# Patient Record
Sex: Male | Born: 1940 | Race: White | Hispanic: No | Marital: Married | Smoking: Never smoker
Health system: Southern US, Community
[De-identification: ages and names within clinical notes are randomized; demographics above are authoritative.]

## PROBLEM LIST (undated history)

## (undated) DIAGNOSIS — G459 Transient cerebral ischemic attack, unspecified: Secondary | ICD-10-CM

---

## 2014-02-23 ENCOUNTER — Emergency Department (HOSPITAL_COMMUNITY): Payer: Medicare Other

## 2014-02-23 ENCOUNTER — Emergency Department (HOSPITAL_COMMUNITY)
Admission: EM | Admit: 2014-02-23 | Discharge: 2014-02-23 | Disposition: A | Discharge status: Home / Self Care | Payer: Medicare Other | Attending: Emergency Medicine | Admitting: Emergency Medicine

## 2014-02-23 ENCOUNTER — Encounter (HOSPITAL_COMMUNITY): Payer: Self-pay | Admitting: Emergency Medicine

## 2014-02-23 DIAGNOSIS — Y92009 Unspecified place in unspecified non-institutional (private) residence as the place of occurrence of the external cause: Secondary | ICD-10-CM | POA: Insufficient documentation

## 2014-02-23 DIAGNOSIS — W540XXA Bitten by dog, initial encounter: Secondary | ICD-10-CM | POA: Diagnosis present

## 2014-02-23 DIAGNOSIS — S61409A Unspecified open wound of unspecified hand, initial encounter: Secondary | ICD-10-CM | POA: Insufficient documentation

## 2014-02-23 DIAGNOSIS — Z23 Encounter for immunization: Secondary | ICD-10-CM | POA: Insufficient documentation

## 2014-02-23 DIAGNOSIS — S61209A Unspecified open wound of unspecified finger without damage to nail, initial encounter: Secondary | ICD-10-CM | POA: Insufficient documentation

## 2014-02-23 DIAGNOSIS — S61419A Laceration without foreign body of unspecified hand, initial encounter: Secondary | ICD-10-CM | POA: Diagnosis present

## 2014-02-23 DIAGNOSIS — Z8673 Personal history of transient ischemic attack (TIA), and cerebral infarction without residual deficits: Secondary | ICD-10-CM | POA: Insufficient documentation

## 2014-02-23 DIAGNOSIS — S63289A Dislocation of proximal interphalangeal joint of unspecified finger, initial encounter: Secondary | ICD-10-CM | POA: Diagnosis present

## 2014-02-23 DIAGNOSIS — Y9389 Activity, other specified: Secondary | ICD-10-CM | POA: Insufficient documentation

## 2014-02-23 DIAGNOSIS — S63279A Dislocation of unspecified interphalangeal joint of unspecified finger, initial encounter: Secondary | ICD-10-CM

## 2014-02-23 HISTORY — DX: Transient cerebral ischemic attack, unspecified: G45.9

## 2014-02-23 MED ORDER — MORPHINE SULFATE 4 MG/ML IJ SOLN
4.0000 mg | Freq: Once | INTRAMUSCULAR | Status: AC
  Administered 2014-02-23: 4 mg via INTRAVENOUS
  Filled 2014-02-23: qty 1

## 2014-02-23 MED ORDER — OXYCODONE-ACETAMINOPHEN 5-325 MG PO TABS: 1.0000 | ORAL_TABLET | Freq: Four times a day (QID) | ORAL | Status: AC | PRN

## 2014-02-23 MED ORDER — TETANUS-DIPHTH-ACELL PERTUSSIS 5-2.5-18.5 LF-MCG/0.5 IM SUSP
0.5000 mL | Freq: Once | INTRAMUSCULAR | Status: AC
  Administered 2014-02-23: 0.5 mL via INTRAMUSCULAR
  Filled 2014-02-23: qty 0.5

## 2014-02-23 MED ORDER — CEFAZOLIN SODIUM 1-5 GM-% IV SOLN
1.0000 g | Freq: Once | INTRAVENOUS | Status: AC
  Administered 2014-02-23: 1 g via INTRAVENOUS
  Filled 2014-02-23: qty 50

## 2014-02-23 MED ORDER — AMOXICILLIN-POT CLAVULANATE 875-125 MG PO TABS: 1.0000 | ORAL_TABLET | Freq: Two times a day (BID) | ORAL | Status: AC

## 2014-02-23 MED ORDER — OXYCODONE-ACETAMINOPHEN 5-325 MG PO TABS
1.0000 | ORAL_TABLET | Freq: Once | ORAL | Status: AC
  Administered 2014-02-23: 1 via ORAL
  Filled 2014-02-23: qty 1

## 2014-02-23 MED ORDER — AMOXICILLIN-POT CLAVULANATE 875-125 MG PO TABS
1.0000 | ORAL_TABLET | Freq: Once | ORAL | Status: AC
  Administered 2014-02-23: 1 via ORAL
  Filled 2014-02-23: qty 1

## 2014-02-23 NOTE — Discharge Instructions (Signed)
Animal Bite °An animal bite can result in a scratch on the skin, deep open cut, puncture of the skin, crush injury, or tearing away of the skin or a body part. Dogs are responsible for most animal bites. Children are bitten more often than adults. An animal bite can range from very mild to more serious. A small bite from your house pet is no cause for alarm. However, some animal bites can become infected or injure a bone or other tissue. You must seek medical care if: °· The skin is broken and bleeding does not slow down or stop after 15 minutes. °· The puncture is deep and difficult to clean (such as a cat bite). °· Pain, warmth, redness, or pus develops around the wound. °· The bite is from a stray animal or rodent. There may be a risk of rabies infection. °· The bite is from a snake, raccoon, skunk, fox, coyote, or bat. There may be a risk of rabies infection. °· The person bitten has a chronic illness such as diabetes, liver disease, or cancer, or the person takes medicine that lowers the immune system. °· There is concern about the location and severity of the bite. °It is important to clean and protect an animal bite wound right away to prevent infection. Follow these steps: °· Clean the wound with plenty of water and soap. °· Apply an antibiotic cream. °· Apply gentle pressure over the wound with a clean towel or gauze to slow or stop bleeding. °· Elevate the affected area above the heart to help stop any bleeding. °· Seek medical care. Getting medical care within 8 hours of the animal bite leads to the best possible outcome. °DIAGNOSIS  °Your caregiver will most likely: °· Take a detailed history of the animal and the bite injury. °· Perform a wound exam. °· Take your medical history. °Blood tests or X-rays may be performed. Sometimes, infected bite wounds are cultured and sent to a lab to identify the infectious bacteria.  °TREATMENT  °Medical treatment will depend on the location and type of animal bite as  well as the patient's medical history. Treatment may include: °· Wound care, such as cleaning and flushing the wound with saline solution, bandaging, and elevating the affected area. °· Antibiotics. °· Tetanus immunization. °· Rabies immunization. °· Leaving the wound open to heal. This is often done with animal bites, due to the high risk of infection. However, in certain cases, wound closure with stitches, wound adhesive, skin adhesive strips, or staples may be used. ° Infected bites that are left untreated may require intravenous (IV) antibiotics and surgical treatment in the hospital. °HOME CARE INSTRUCTIONS °· Follow your caregiver's instructions for wound care. °· Take all medicines as directed. °· If your caregiver prescribes antibiotics, take them as directed. Finish them even if you start to feel better. °· Follow up with your caregiver for further exams or immunizations as directed. °You may need a tetanus shot if: °· You cannot remember when you had your last tetanus shot. °· You have never had a tetanus shot. °· The injury broke your skin. °If you get a tetanus shot, your arm may swell, get red, and feel warm to the touch. This is common and not a problem. If you need a tetanus shot and you choose not to have one, there is a rare chance of getting tetanus. Sickness from tetanus can be serious. °SEEK MEDICAL CARE IF: °· You notice warmth, redness, soreness, swelling, pus discharge, or a bad   smell coming from the wound.  You have a red line on the skin coming from the wound.  You have a fever, chills, or a general ill feeling.  You have nausea or vomiting.  You have continued or worsening pain.  You have trouble moving the injured part.  You have other questions or concerns. MAKE SURE YOU:  Understand these instructions.  Will watch your condition.  Will get help right away if you are not doing well or get worse. Document Released: 06/13/2011 Document Revised: 12/18/2011 Document  Reviewed: 06/13/2011 [facility]Exit[facility] Patient Information 2014 [place], [place].  Cast or Splint Care Casts and splints support injured limbs and keep bones from moving while they heal. It is important to care for your cast or splint at home.  HOME CARE INSTRUCTIONS  Keep the cast or splint uncovered during the drying period. It can take 24 to 48 hours to dry if it is made of plaster. A fiberglass cast will dry in less than 1 hour.  Do not rest the cast on anything harder than a pillow for the first 24 hours.  Do not put weight on your injured limb or apply pressure to the cast until your health care provider gives you permission.  Keep the cast or splint dry. Wet casts or splints can lose their shape and may not support the limb as well. A wet cast that has lost its shape can also create harmful pressure on your skin when it dries. Also, wet skin can become infected.  Cover the cast or splint with a plastic bag when bathing or when out in the rain or snow. If the cast is on the trunk of the body, take sponge baths until the cast is removed.  If your cast does become wet, dry it with a towel or a blow dryer on the cool setting only.  Keep your cast or splint clean. Soiled casts may be wiped with a moistened cloth.  Do not place any hard or soft foreign objects under your cast or splint, such as cotton, toilet paper, lotion, or powder.  Do not try to scratch the skin under the cast with any object. The object could get stuck inside the cast. Also, scratching could lead to an infection. If itching is a problem, use a blow dryer on a cool setting to relieve discomfort.  Do not trim or cut your cast or remove padding from inside of it.  Exercise all joints next to the injury that are not immobilized by the cast or splint. For example, if you have a long leg cast, exercise the hip joint and toes. If you have an arm cast or splint, exercise the shoulder, elbow, thumb, and fingers.  Elevate your  injured arm or leg on 1 or 2 pillows for the first 1 to 3 days to decrease swelling and pain.It is best if you can comfortably elevate your cast so it is higher than your heart. SEEK MEDICAL CARE IF:   Your cast or splint cracks.  Your cast or splint is too tight or too loose.  You have unbearable itching inside the cast.  Your cast becomes wet or develops a soft spot or area.  You have a bad smell coming from inside your cast.  You get an object stuck under your cast.  Your skin around the cast becomes red or raw.  You have new pain or worsening pain after the cast has been applied. SEEK IMMEDIATE MEDICAL CARE IF:   You have  fluid leaking through the cast.  You are unable to move your fingers or toes.  You have discolored (blue or white), cool, painful, or very swollen fingers or toes beyond the cast.  You have tingling or numbness around the injured area.  You have severe pain or pressure under the cast.  You have any difficulty with your breathing or have shortness of breath.  You have chest pain. Document Released: 09/22/2000 Document Revised: 07/16/2013 Document Reviewed: 04/03/2013 [facility] Patient Information 2014 [place]Exit[place], [place].

## 2014-02-23 NOTE — ED Provider Notes (Signed)
CSN: [id number]     Arrival date & time 02/23/14  1558 History   First MD Initiated Contact with Patient 02/23/14 1641     Chief Complaint  Patient presents with  . Animal Bite     (Consider location/radiation/quality/duration/timing/severity/associated sxs/prior Treatment) HPI Comments: 73 year old male who presents with a dog bite which occurred approximately 2 hours ago. He states that his household whippets began fighting and he tried to separate them. He suffered a laceration and likely fracture in his right hand. He states that he is right-handed. He is unsure of his tetanus status. His pain is currently a 4/10. He rates the severity as moderate. He notes that the rabies vaccine in both his dogs is up-to-date and has this documentation with him. There is no concern for rabies in these animals as they're known household dogs. He states that his pain is aching and sharp in nature. It is constant. He has not taken anything for the pain yet. Nothing has relieved his pain thus far.  Patient is a 73 y.o. male presenting with animal bite. The history is provided by the patient.  Animal Bite Associated symptoms: no fever and no numbness     Past Medical History  Diagnosis Date  . TIA (transient ischemic attack)    History reviewed. No pertinent past surgical history. No family history on file. History  Substance Use Topics  . Smoking status: Never Smoker   . Smokeless tobacco: Not on file  . Alcohol Use: Yes    Review of Systems  Constitutional: Negative for fever.  HENT: Negative for drooling and rhinorrhea.   Eyes: Negative for pain.  Respiratory: Negative for cough and shortness of breath.   Cardiovascular: Negative for chest pain and leg swelling.  Gastrointestinal: Negative for nausea, vomiting, abdominal pain and diarrhea.  Genitourinary: Negative for dysuria and hematuria.  Musculoskeletal: Negative for gait problem and neck pain.  Skin: Negative for color change.    Neurological: Negative for numbness and headaches.  Hematological: Negative for adenopathy.  Psychiatric/Behavioral: Negative for behavioral problems.  All other systems reviewed and are negative.     Allergies  Review of patient's allergies indicates not on file.  Home Medications   Prior to Admission medications   Not on File   BP 141/72  Pulse 80  Temp(Src) 98.1 F (36.7 C) (Oral)  Resp 18  Ht 6\' 1"  (1.854 m)  Wt 201 lb (91.173 kg)  BMI 26.52 kg/m2  SpO2 97% Physical Exam  Nursing note and vitals reviewed. Constitutional: He is oriented to person, place, and time. He appears well-developed and well-nourished.  HENT:  Head: Normocephalic and atraumatic.  Right Ear: External ear normal.  Left Ear: External ear normal.  Nose: Nose normal.  Mouth/Throat: Oropharynx is clear and moist. No oropharyngeal exudate.  Eyes: Conjunctivae and EOM are normal. Pupils are equal, round, and reactive to light.  Neck: Normal range of motion. Neck supple.  Cardiovascular: Normal rate, regular rhythm, normal heart sounds and intact distal pulses.  Exam reveals no gallop and no friction rub.   No murmur heard. Pulmonary/Chest: Effort normal and breath sounds normal. No respiratory distress. He has no wheezes.  Abdominal: Soft. Bowel sounds are normal. He exhibits no distension. There is no tenderness. There is no rebound and no guarding.  Musculoskeletal: Normal range of motion. He exhibits no edema and no tenderness.  2 superficial stellate lacerations approximately 1 cm and 2 cm in length located on the base and volar aspect of the  ring finger on the right hand.  A 4 cm laceration is also noted on the volar aspect of the mid phalanx of the little finger on the right hand. There is exposure of the PIP joint of this finger w/ mild to mod gross dislocation of the finger laterally.   1cm stellate laceration to distal lateral aspect of 5th digit of right hand.  <2sec cap refill in 4th and  5th distal phalanx on right hand.   Sensation is diffusely intact in the right hand.  2+ pulses in the distal right upper extremity.  Normal range of motion and motor skills in the right hand. Motor skills deferred in the small digit of the right hand due to the gross deformity.  A small superficial puncture wound is noted on the dorsal aspect of the left hand.    Neurological: He is alert and oriented to person, place, and time.  Skin: Skin is warm and dry.  Psychiatric: He has a normal mood and affect. His behavior is normal.    ED Course  LACERATION REPAIR Date/Time: 02/23/2014 9:13 PM Performed by: Purvis SheffieldHARRISON, Kayra Crowell, S Authorized by: Purvis SheffieldHARRISON, Kyera Felan, S Consent: Verbal consent obtained. Risks and benefits: risks, benefits and alternatives were discussed Consent given by: patient Patient understanding: patient states understanding of the procedure being performed Patient identity confirmed: verbally with patient, arm band, provided demographic data and hospital-assigned identification number Time out: Immediately prior to procedure a "time out" was called to verify the correct patient, procedure, equipment, support staff and site/side marked as required. Location: base of 5th digit, right hand. Laceration length: 4 cm Foreign bodies: no foreign bodies Tendon involvement: none Nerve involvement: none Vascular damage: no Anesthesia: local infiltration Local anesthetic: lidocaine 1% without epinephrine Anesthetic total: 5 ml Patient sedated: no Preparation: Patient was prepped and draped in the usual sterile fashion. Irrigation solution: saline Irrigation method: jet lavage Amount of cleaning: extensive (2 liters) Debridement: none Degree of undermining: none Skin closure: 5-0 Prolene Number of sutures: 3 Technique: simple Approximation: loose Approximation difficulty: simple Dressing: antibiotic ointment and 4x4 sterile gauze Patient tolerance: Patient tolerated the  procedure well with no immediate complications.  Reduction of dislocation Date/Time: 02/23/2014 9:32 PM Performed by: Purvis SheffieldHARRISON, Shaylah Mcghie, S Authorized by: Purvis SheffieldHARRISON, Keelia Graybill, S Consent: Verbal consent obtained. Risks and benefits: risks, benefits and alternatives were discussed Consent given by: patient Patient understanding: patient states understanding of the procedure being performed Required items: required blood products, implants, devices, and special equipment available Patient identity confirmed: verbally with patient, arm band, provided demographic data and hospital-assigned identification number Time out: Immediately prior to procedure a "time out" was called to verify the correct patient, procedure, equipment, support staff and site/side marked as required. Preparation: Patient was prepped and draped in the usual sterile fashion. Local anesthesia used: yes Anesthesia: digital block Local anesthetic: lidocaine 2% without epinephrine Anesthetic total: 5 ml Patient sedated: no Patient tolerance: Patient tolerated the procedure well with no immediate complications.   (including critical care time) LACERATION REPAIR Performed by: Junius ArgyleForrest S Adonai Helzer Consent: Verbal consent obtained. Risks and benefits: risks, benefits and alternatives were discussed Patient identity confirmed: provided demographic data Time out performed prior to procedure Prepped and Draped in normal sterile fashion Wound explored Laceration Location: base of 4th digit, right hand Laceration Length: 2 cm No Foreign Bodies seen or palpated Anesthesia: local infiltration Local anesthetic: lidocaine 2%  W/out epi Anesthetic total: 4 ml Irrigation method: syringe Amount of cleaning: extensive Skin closure: 5-0 prolene Number of  sutures or staples: 1 Technique: simple Patient tolerance: Patient tolerated the procedure well with no immediate complications. Labs Review Labs Reviewed - No data to  display  Imaging Review Dg Hand Complete Right  02/23/2014   CLINICAL DATA:  Status post reduction.  Dog bite.  EXAM: RIGHT HAND - COMPLETE 3+ VIEW  COMPARISON:  02/23/2014 at 1805 hr.  FINDINGS: Overlying bandage obscures bony detail about the fourth and fifth digits. Degenerate change at the base of the first metacarpal. Overlap of fingers on the lateral view. Interval relocation of the proximal interphalangeal joint of the fifth digit. No convincing evidence of underlying fracture. Question remote trauma of the fifth metacarpal neck.  IMPRESSION: Interval relocation of the proximal interphalangeal joint of the fifth digit   Electronically Signed   By: Jeronimo GreavesKyle  Talbot M.D.   On: 02/23/2014 21:05   Dg Hand Complete Right  02/23/2014   CLINICAL DATA:  Laceration to base of fourth/ fifth digit. Exposed bone on the fifth digit.  EXAM: RIGHT HAND - COMPLETE 3+ VIEW  COMPARISON:  None.  FINDINGS: Degenerative changes at the base of the first metacarpal. Scattered degenerative changes also identified, including within the second-third interphalangeal joints.  Dislocation of the proximal interphalangeal joint of the fifth digit, with the middle phalanx dislocated ulnarly and posteriorly. Soft tissue injury, consistent with an open dislocation. No definite fracture identified. No radiopaque foreign object.  IMPRESSION: Open dislocation of the proximal interphalangeal joint of the fifth digit.   Electronically Signed   By: Jeronimo GreavesKyle  Talbot M.D.   On: 02/23/2014 18:40     EKG Interpretation None          MDM   Final diagnoses:  Dislocation of PIP joint of finger  Dog bite  Laceration of hand    5:01 PM 73 y.o. male on aggrenox d/t hx of TIA who presents after a dog bite. Irrigated and cleaned wounds. Will get plain films, update tetanus, pain control, Ancef IV due to open fracture. Will perform digital block prior to plain films.   Discussed case w/ Dr. Mina MarbleWeingold who recommends irrigation, reduction,  repair, splint, and abx.   I reduced the right 5th digit after a digital block. He tolerated the procedure well. Re-examination of the hand shows that motor skills remain intact. 2+ distal pulses. Wound irrigated w/ 2L sterile saline.I placed a few sutures to hold the wound together. Will have nurse bandage, ortho tech to splint.   9:33 PM: Post reduction films reviewed. Pt continues to appear well, pain controlled. Will place on augmentin. I have discussed the diagnosis/risks/treatment options with the patient and believe the pt to be eligible for discharge home to follow-up with Dr. Mina MarbleWeingold here tomorrow. We also discussed returning to the ED immediately if new or worsening sx occur. We discussed the sx which are most concerning (e.g., concern for infection, increased bleeding, worsening pain) that necessitate immediate return. Medications administered to the patient during their visit and any new prescriptions provided to the patient are listed below.  Medications given during this visit Medications  Tdap (BOOSTRIX) injection 0.5 mL (0.5 mLs Intramuscular Given 02/23/14 1723)  ceFAZolin (ANCEF) IVPB 1 g/50 mL premix (0 g Intravenous Stopped 02/23/14 1844)  morphine 4 MG/ML injection 4 mg (4 mg Intravenous Given 02/23/14 1723)  amoxicillin-clavulanate (AUGMENTIN) 875-125 MG per tablet 1 tablet (1 tablet Oral Given 02/23/14 1959)    New Prescriptions   AMOXICILLIN-CLAVULANATE (AUGMENTIN) 875-125 MG PER TABLET    Take 1 tablet by mouth 2 (  two) times daily. One po bid x 7 days   OXYCODONE-ACETAMINOPHEN (PERCOCET) 5-325 MG PER TABLET    Take 1-2 tablets by mouth every 6 (six) hours as needed for moderate pain.     Junius Argyle, MD 02/23/14 2248

## 2014-02-23 NOTE — Progress Notes (Signed)
Orthopedic Tech Progress Note Patient Details:  Roberto MartinezWilliam C Medina [birth year] [id number]  Ortho Devices Type of Ortho Device: Ace wrap;Finger splint Ortho Device/Splint Location: rue Ortho Device/Splint Interventions: Application   Marvina Danner 02/23/2014, 9:39 PM

## 2014-02-23 NOTE — ED Notes (Signed)
Pt st's he was trying to break up his 2 dogs from fighting and one of his dogs bit him.  Pt has lac to right 4th and 5th fingers with deformity to 5 finger.

## 2015-03-21 IMAGING — CR DG HAND COMPLETE 3+V*R*
3 series · 3 of 3 positions shown · non-contrast
Comparison: 02/23/2014 at 8895 hr.

CLINICAL DATA: Status post reduction.  Dog bite.

EXAM:
RIGHT HAND - COMPLETE 3+ VIEW

[x hand pa right]
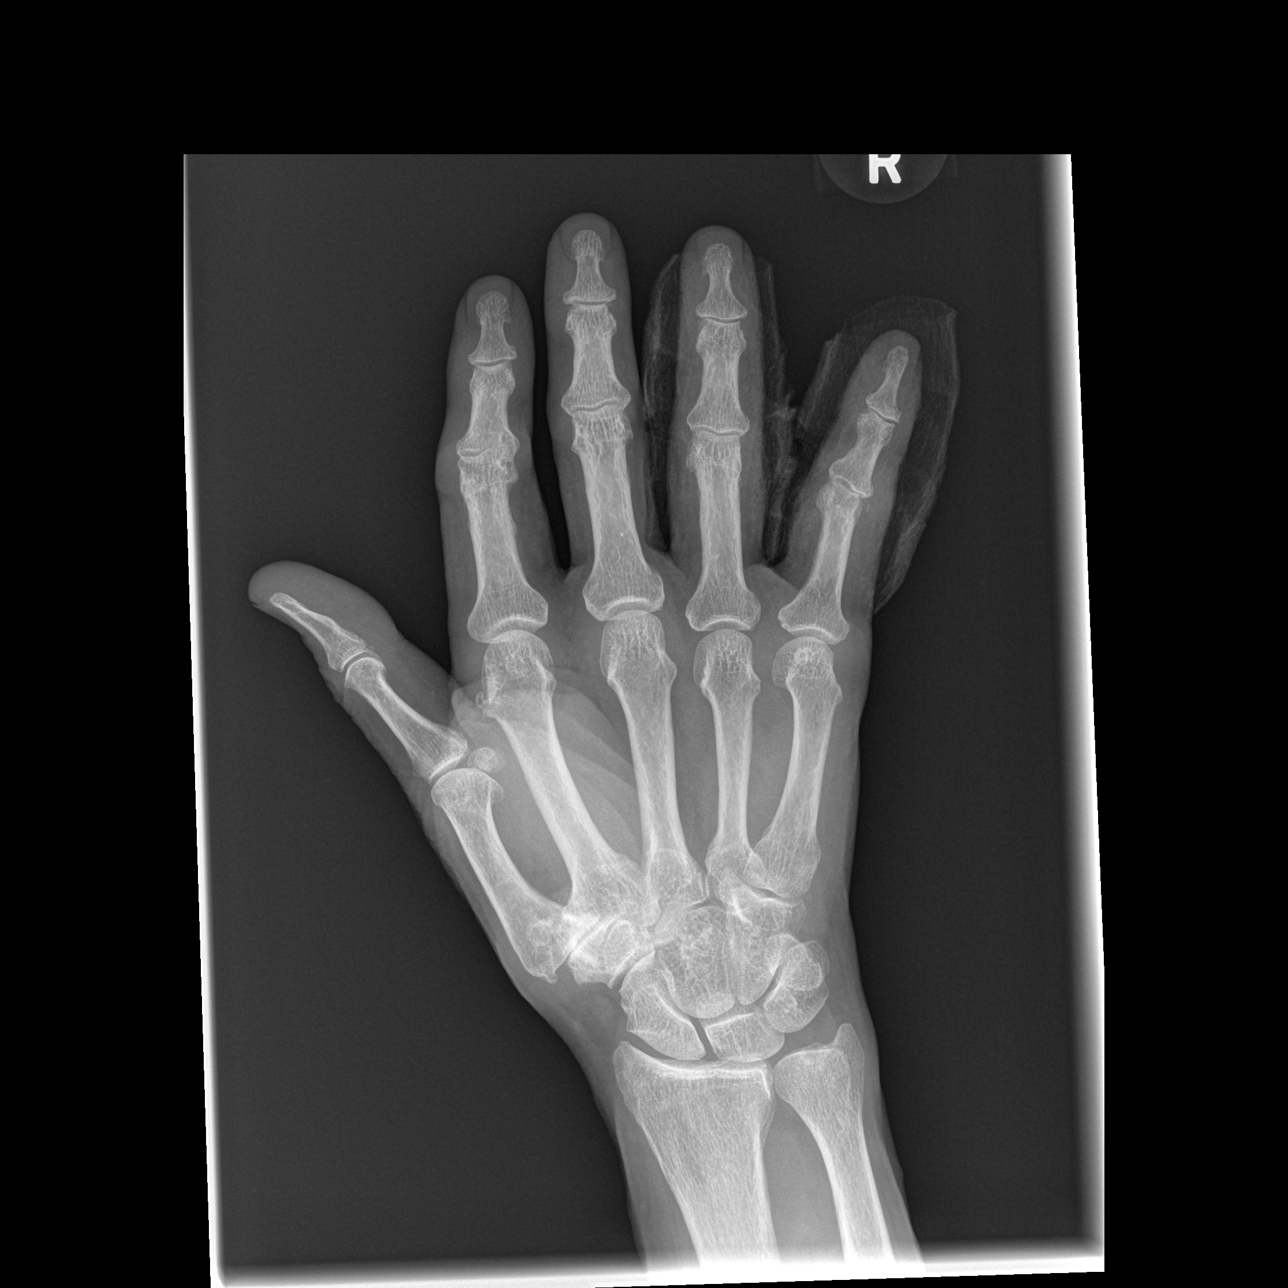

[x hand oblique right]
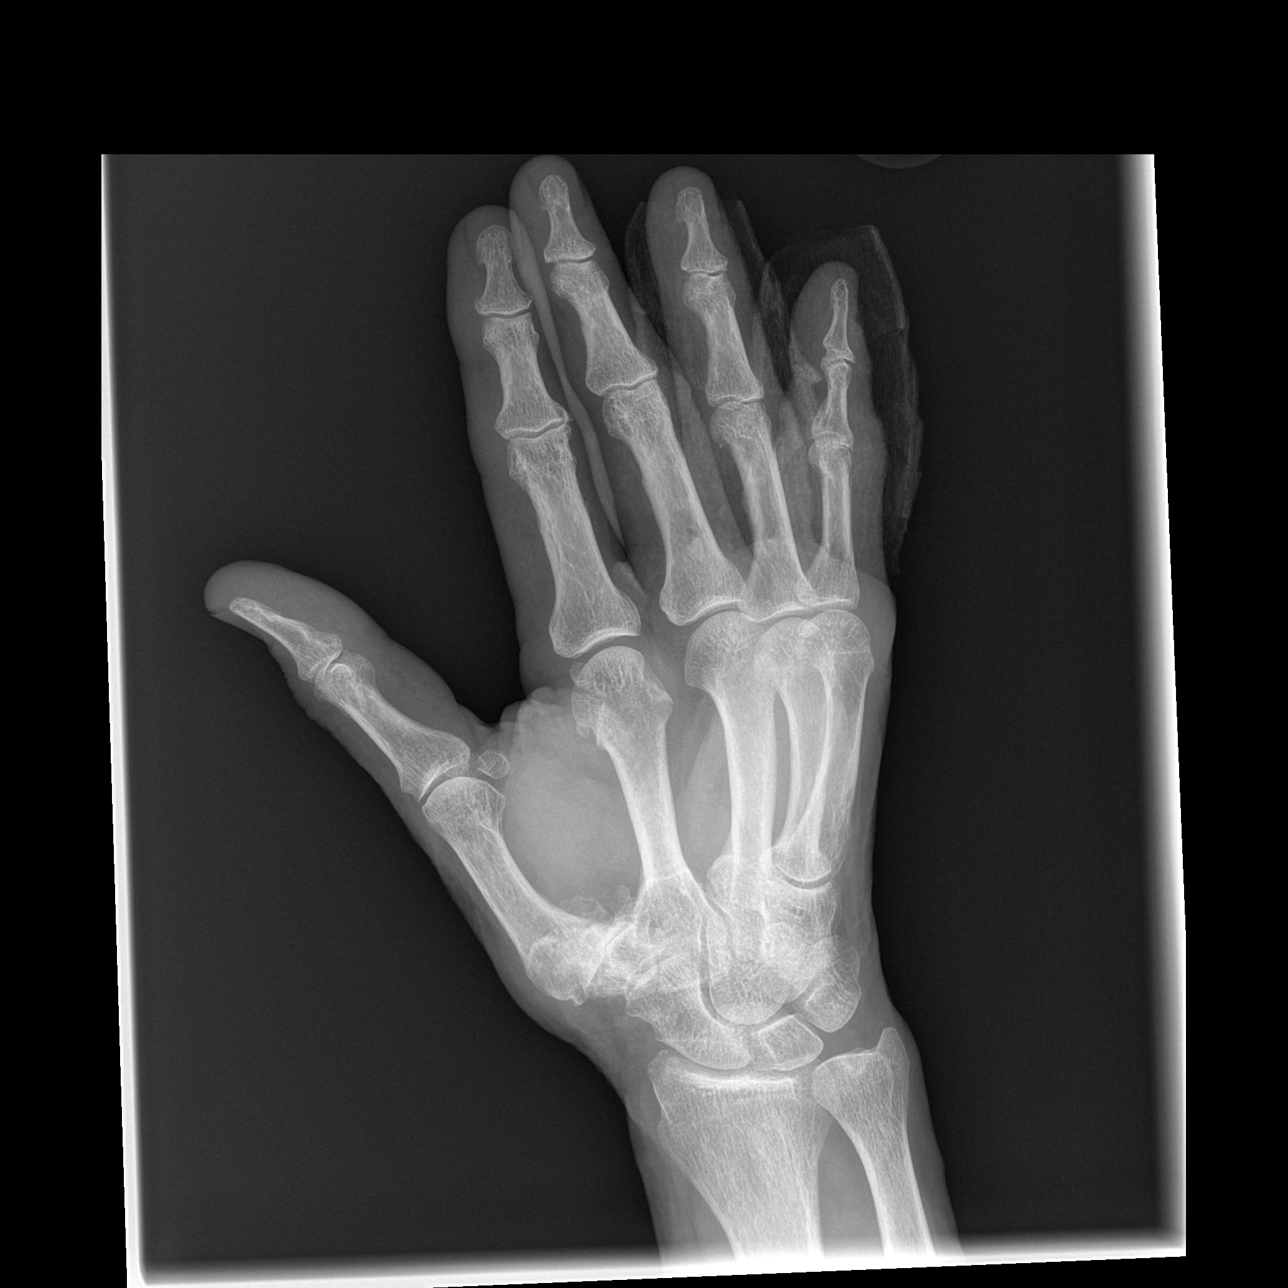

[x hand lat right]
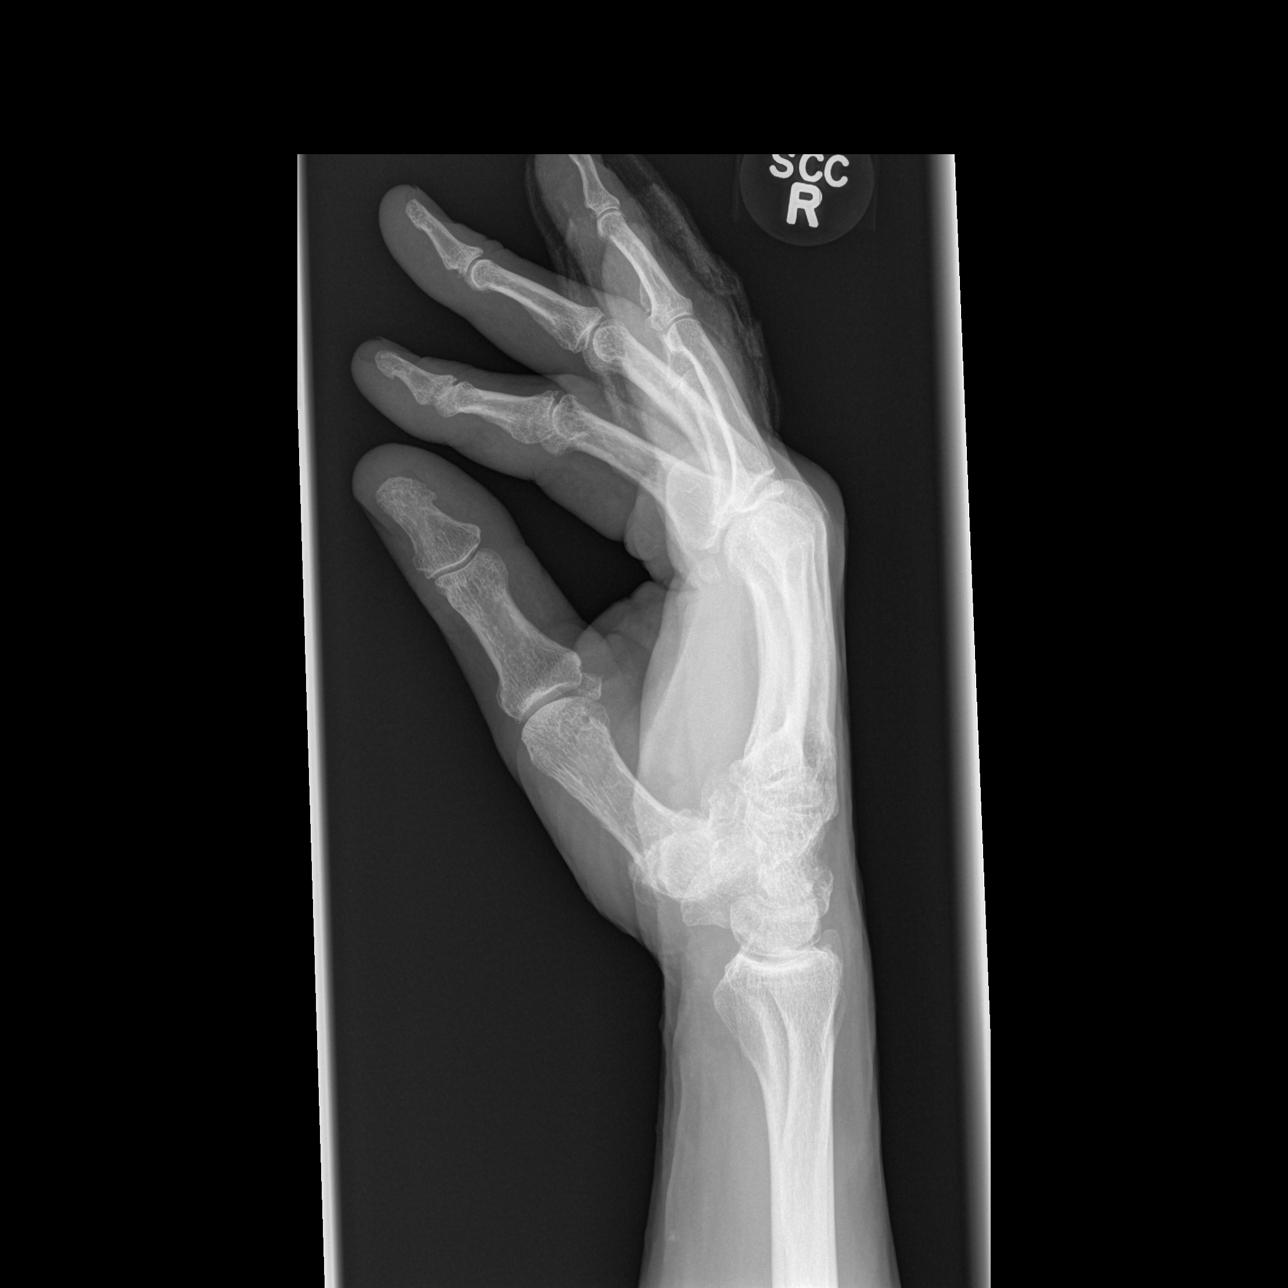

[3 of 3 positions shown; findings below may reference images not displayed]

FINDINGS: Overlying bandage obscures bony detail about the fourth and fifth
digits. Degenerate change at the base of the first metacarpal.
Overlap of fingers on the lateral view. Interval relocation of the
proximal interphalangeal joint of the fifth digit. No convincing
evidence of underlying fracture. Question remote trauma of the fifth
metacarpal neck.
IMPRESSION: Interval relocation of the proximal interphalangeal joint of the
fifth digit

## 2023-12-08 DEATH — deceased
